# Patient Record
Sex: Male | Born: 1985 | Race: White | Hispanic: No | Marital: Single | Smoking: Current every day smoker
Health system: Southern US, Community
[De-identification: ages and names within clinical notes are randomized; demographics above are authoritative.]

## PROBLEM LIST (undated history)

## (undated) DIAGNOSIS — B1920 Unspecified viral hepatitis C without hepatic coma: Secondary | ICD-10-CM

## (undated) DIAGNOSIS — F419 Anxiety disorder, unspecified: Secondary | ICD-10-CM

## (undated) DIAGNOSIS — M549 Dorsalgia, unspecified: Secondary | ICD-10-CM

---

## 2009-02-05 ENCOUNTER — Emergency Department (HOSPITAL_BASED_OUTPATIENT_CLINIC_OR_DEPARTMENT_OTHER): Admission: EM | Admit: 2009-02-05 | Discharge: 2009-02-05 | Payer: Self-pay | Admitting: Emergency Medicine

## 2009-02-05 ENCOUNTER — Ambulatory Visit: Payer: Self-pay | Admitting: Diagnostic Radiology

## 2009-02-13 ENCOUNTER — Emergency Department (HOSPITAL_BASED_OUTPATIENT_CLINIC_OR_DEPARTMENT_OTHER): Admission: EM | Admit: 2009-02-13 | Discharge: 2009-02-13 | Payer: Self-pay | Admitting: Emergency Medicine

## 2009-03-16 ENCOUNTER — Emergency Department (HOSPITAL_BASED_OUTPATIENT_CLINIC_OR_DEPARTMENT_OTHER): Admission: EM | Admit: 2009-03-16 | Discharge: 2009-03-16 | Payer: Self-pay | Admitting: Emergency Medicine

## 2009-03-16 ENCOUNTER — Ambulatory Visit: Payer: Self-pay | Admitting: Diagnostic Radiology

## 2009-03-30 ENCOUNTER — Ambulatory Visit: Payer: Self-pay | Admitting: Interventional Radiology

## 2009-03-30 ENCOUNTER — Emergency Department (HOSPITAL_BASED_OUTPATIENT_CLINIC_OR_DEPARTMENT_OTHER): Admission: EM | Admit: 2009-03-30 | Discharge: 2009-03-30 | Payer: Self-pay | Admitting: Emergency Medicine

## 2009-04-03 ENCOUNTER — Encounter: Payer: Self-pay | Admitting: Family Medicine

## 2009-04-30 ENCOUNTER — Encounter: Payer: Self-pay | Admitting: Family Medicine

## 2009-06-05 ENCOUNTER — Encounter: Payer: Self-pay | Admitting: Family Medicine

## 2009-07-07 ENCOUNTER — Encounter: Payer: Self-pay | Admitting: Family Medicine

## 2009-07-18 ENCOUNTER — Emergency Department (HOSPITAL_BASED_OUTPATIENT_CLINIC_OR_DEPARTMENT_OTHER): Admission: EM | Admit: 2009-07-18 | Discharge: 2009-07-18 | Payer: Self-pay | Admitting: Emergency Medicine

## 2009-07-23 ENCOUNTER — Emergency Department (HOSPITAL_BASED_OUTPATIENT_CLINIC_OR_DEPARTMENT_OTHER): Admission: EM | Admit: 2009-07-23 | Discharge: 2009-07-23 | Payer: Self-pay | Admitting: Emergency Medicine

## 2009-07-24 ENCOUNTER — Ambulatory Visit: Payer: Self-pay | Admitting: Family Medicine

## 2009-07-24 DIAGNOSIS — M545 Low back pain: Secondary | ICD-10-CM

## 2009-07-26 ENCOUNTER — Telehealth: Payer: Self-pay | Admitting: Family Medicine

## 2009-08-01 ENCOUNTER — Telehealth: Payer: Self-pay | Admitting: Family Medicine

## 2009-08-10 ENCOUNTER — Ambulatory Visit: Payer: Self-pay | Admitting: Diagnostic Radiology

## 2009-08-10 ENCOUNTER — Emergency Department (HOSPITAL_BASED_OUTPATIENT_CLINIC_OR_DEPARTMENT_OTHER): Admission: EM | Admit: 2009-08-10 | Discharge: 2009-08-10 | Payer: Self-pay | Admitting: Emergency Medicine

## 2009-08-27 ENCOUNTER — Encounter: Payer: Self-pay | Admitting: Family Medicine

## 2009-08-28 ENCOUNTER — Telehealth: Payer: Self-pay | Admitting: Family Medicine

## 2009-09-18 ENCOUNTER — Emergency Department (HOSPITAL_BASED_OUTPATIENT_CLINIC_OR_DEPARTMENT_OTHER): Admission: EM | Admit: 2009-09-18 | Discharge: 2009-09-18 | Payer: Self-pay | Admitting: Emergency Medicine

## 2009-09-27 ENCOUNTER — Ambulatory Visit: Payer: Self-pay | Admitting: Interventional Radiology

## 2009-09-27 ENCOUNTER — Emergency Department (HOSPITAL_BASED_OUTPATIENT_CLINIC_OR_DEPARTMENT_OTHER): Admission: EM | Admit: 2009-09-27 | Discharge: 2009-09-27 | Payer: Self-pay | Admitting: Emergency Medicine

## 2009-11-11 ENCOUNTER — Emergency Department (HOSPITAL_BASED_OUTPATIENT_CLINIC_OR_DEPARTMENT_OTHER): Admission: EM | Admit: 2009-11-11 | Discharge: 2009-11-11 | Payer: Self-pay | Admitting: Emergency Medicine

## 2009-11-13 ENCOUNTER — Ambulatory Visit: Payer: Self-pay | Admitting: Radiology

## 2009-11-13 ENCOUNTER — Emergency Department (HOSPITAL_BASED_OUTPATIENT_CLINIC_OR_DEPARTMENT_OTHER): Admission: EM | Admit: 2009-11-13 | Discharge: 2009-11-13 | Payer: Self-pay | Admitting: Emergency Medicine

## 2009-11-22 ENCOUNTER — Emergency Department (HOSPITAL_BASED_OUTPATIENT_CLINIC_OR_DEPARTMENT_OTHER): Admission: EM | Admit: 2009-11-22 | Discharge: 2009-11-23 | Payer: Self-pay | Admitting: Emergency Medicine

## 2009-11-22 ENCOUNTER — Ambulatory Visit: Payer: Self-pay | Admitting: Diagnostic Radiology

## 2009-11-29 ENCOUNTER — Emergency Department (HOSPITAL_BASED_OUTPATIENT_CLINIC_OR_DEPARTMENT_OTHER): Admission: EM | Admit: 2009-11-29 | Discharge: 2009-11-29 | Payer: Self-pay | Admitting: Emergency Medicine

## 2009-12-03 ENCOUNTER — Emergency Department (HOSPITAL_BASED_OUTPATIENT_CLINIC_OR_DEPARTMENT_OTHER): Admission: EM | Admit: 2009-12-03 | Discharge: 2009-12-03 | Payer: Self-pay | Admitting: Emergency Medicine

## 2010-02-18 ENCOUNTER — Emergency Department (HOSPITAL_BASED_OUTPATIENT_CLINIC_OR_DEPARTMENT_OTHER)
Admission: EM | Admit: 2010-02-18 | Discharge: 2010-02-18 | Payer: Self-pay | Source: Home / Self Care | Admitting: Emergency Medicine

## 2010-02-19 ENCOUNTER — Emergency Department (HOSPITAL_BASED_OUTPATIENT_CLINIC_OR_DEPARTMENT_OTHER)
Admission: EM | Admit: 2010-02-19 | Discharge: 2010-02-19 | Payer: Self-pay | Source: Home / Self Care | Admitting: Emergency Medicine

## 2010-02-19 ENCOUNTER — Ambulatory Visit: Payer: Self-pay | Admitting: Emergency Medicine

## 2010-03-18 ENCOUNTER — Emergency Department (HOSPITAL_BASED_OUTPATIENT_CLINIC_OR_DEPARTMENT_OTHER)
Admission: EM | Admit: 2010-03-18 | Discharge: 2010-03-18 | Payer: Self-pay | Source: Home / Self Care | Admitting: Emergency Medicine
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
## 2010-04-03 NOTE — Letter (Signed)
Summary: [facility]  [facility]   Imported By: Lanelle Bal 08/08/2009 11:13:22  _____________________________________________________________________  External Attachment:    Type:   Image     Comment:   External Document

## 2010-04-03 NOTE — Letter (Signed)
Summary: [facility]  [facility]   Imported By: Lanelle Bal 08/08/2009 11:14:10  _____________________________________________________________________  External Attachment:    Type:   Image     Comment:   External Document

## 2010-04-03 NOTE — Letter (Signed)
Summary: [facility]  [facility]   Imported By: Lanelle Bal 08/08/2009 11:16:56  _____________________________________________________________________  External Attachment:    Type:   Image     Comment:   External Document

## 2010-04-03 NOTE — Progress Notes (Signed)
Summary: Pain meds  Phone Note Call from Patient   Caller: Patient Summary of Call: Pt called stating he is in severe pain and meds given at [facility] are not helping. I explained to Pt that he will not be given anything stronger until old records have been received and reviewed.  Initial call taken by: Payton Spark CMA,  Jul 26, 2009 10:56 AM  Follow-up for Phone Call        I have no records. No narcotics will be given. Follow-up by: Seymour Bars DO,  Jul 26, 2009 11:11 AM

## 2010-04-03 NOTE — Letter (Signed)
Summary: [facility]  [facility]   Imported By: Lanelle Bal 08/08/2009 11:17:57  _____________________________________________________________________  External Attachment:    Type:   Image     Comment:   External Document

## 2010-04-03 NOTE — Progress Notes (Signed)
Summary: Old records and pain meds  Phone Note Call from Patient   Caller: Patient Summary of Call: Pt called this morning stating he planned on dropping off past med records for review. Pt wanted to know if he could wait while notes were reviewed so he could take Rx w/ him. I advised Pt that he could drop off records but did not need to wait bc it may take up to 2 days for notes to be reviewed. Pt said he was in a lot a pain and needed Rx, I advised Pt to go to ED if pain is severe. I also told Pt that when Dr. B reviewed notes I will call him. Pt agreed but has proceeded to call several more times to ask about pain meds.  Initial call taken by: Payton Spark CMA,  Aug 01, 2009 4:17 PM  Follow-up for Phone Call        I reviewed his records and have filled the same RX as previous MD filled.  #40 needs to last for 30 days and he has the next month to fill out a narcotic contract here and sign.  Have him f/u for back pain in 2 mos. Follow-up by: Seymour Bars DO,  Aug 01, 2009 4:19 PM    New/Updated Medications: LORCET 10/650 10-650 MG TABS (HYDROCODONE-ACETAMINOPHEN) 1 tab by mouth two times a day as needed severe pain Prescriptions: LORCET 10/650 10-650 MG TABS (HYDROCODONE-ACETAMINOPHEN) 1 tab by mouth two times a day as needed severe pain  #40 x 0   Entered and Authorized by:   Seymour Bars DO   Signed by:   Seymour Bars DO on 08/01/2009   Method used:   Printed then faxed to ...       [facility] [phone]* (retail)       [address]       [place], [place]  [address]       Ph: [phone]       Fax: [phone]   RxID:   [id number]   Appended Document: Old records and pain meds Pt aware of the above and FULLY agreed and verbalized understanding.

## 2010-04-03 NOTE — Progress Notes (Signed)
Summary: PATIENT FLAGGED- NO NARCOTICS  Phone Note From Pharmacy   Caller: [facility] [address] [facility] Summary of Call: Recived fax from Pharmacy about pt doctor shopping and has been flagged by the [place] Controlled Substance Registry.  He is using multiple pharmacies and EDs.  Not filling ANY other RX's besides narcotics.    Pt will no longer get ANY narcotics filled here. Initial call taken by: Seymour Bars DO,  August 28, 2009 8:15 AM     Appended Document: PATIENT FLAGGED- NO NARCOTICS Pt aware of the above

## 2010-04-03 NOTE — Medication Information (Signed)
Summary: [facility] Record[facility]  [facility]   Imported By: Lanelle Bal 09/06/2009 09:34:05  _____________________________________________________________________  External Attachment:    Type:   Image     Comment:   External Document

## 2010-04-05 NOTE — Assessment & Plan Note (Signed)
Summary: FELL IN SHOWER/BACK PAIN/NP/LP   Vital Signs:  Patient profile:   25 year old male Height:      66 inches (167.64 cm) Weight:      138.0 pounds (62.73 kg) BMI:     22.35 Temp:     98.1 degrees F (36.72 degrees C) oral Pulse rate:   79 / minute BP sitting:   124 / 77  (right arm)  Vitals Entered By: Baxter Hire) (February 19, 2010 3:35 PM) CC: lower back pain Pain Assessment Patient in pain? yes     Location: lower back Intensity: 8 Nutritional Status BMI of 19 -24 = normal  Does patient need assistance? Functional Status Self care Ambulation Normal   Primary Care Provider:  Seymour Bars DO  CC:  lower back pain.  History of Present Illness: 25 yo M here for low back pain.  Patient reports about 3-4 days ago he was in the shower when he slipped and hit low back on fauct spout Had a lot of bruising and cut to low back just above buttocks Reports since then having pain and weakness throughout both legs and it is hard to walk Has been to the ED twice for this issue - given pain medication (oxycodone) on 12/18 - 20 tablets - he has used all of this and it has not helped him. No numbness or tingling in extremities No bowel/bladder dysfunction. Has not tried heat or ice. No abdominal pain. Patient has not had imaging or MRI.  He also does not have insurance which is a major barrier to his care. Has also been to the Emergency Department > 15 times in the past year for different pain complaints (upper back, low back, abdominal pain). Within narcotics database he has been to many different providers and received narcotics prescriptions from different pharmacies - noted in Dr. Ovidio Kin note from [facility].  Habits & Providers  Alcohol-Tobacco-Diet     Alcohol drinks/day: 0     Tobacco Status: current     Cigarette Packs/Day: 0.5  Problems Prior to Update: 1)  Back Pain, Lumbar, Chronic  (ICD-724.2)  Allergies: 1)  ! Tramadol Hcl 2)   Toradol  Family History: Reviewed history from 07/24/2009 and no changes required. mother living, AMI at 63 father A Fib. 5 brothers and 1 sister healthy  Social History: Reviewed history from 07/24/2009 and no changes required. finished 9th grade. Lives with mom, dad, toddler son and his girlfriend. smokes 1/2 ppd x 6 yrs. Denies ETOH. Physically active at work.Smoking Status:  current Packs/Day:  0.5  Physical Exam  General:  alert, well-developed, well-nourished, and well-hydrated.   Msk:  Back: Small healed cut just above buttocks.  No apparent bruising. TTP lumbar spine L > R paraspinal region.  No palpable stepoffs - mild tenderness just to left of spinous process about L4 ROM 50 degrees flexion, 10 degrees extension both limited by pain. Strength 5/5 BLEs but pain in low back with hip flexion and leg extension. Negative SLRs MSRs 1+ and equal in bilateral patellar and achilles tendons. Sensation subjectively less on right compared to left throughout whole leg.   Impression & Recommendations:  Problem # 1:  BACK PAIN, LUMBAR, CHRONIC (ICD-724.2) Assessment Deteriorated Believe this is a contusion with spasm in left lumbar region.  No red flag symptoms and complaints into legs are non-anatomic.  Try prednisone dose pack then switch to aleve twice a day.  Flexeril as needed for spasms but no driving on this.  Icing or heat to help with pain.  Given number for Jaynee Eagles to apply for medication assistance card and to help with imaging/PT coverage - will consider these in future depending on his signs and symptoms.  Advised him I will not prescribe narcotics to him.  His history of drug-seeking through different providers and filling at different pharmacies is concerning.  Recent ED note reports probable malingering.  F/u as needed.  Complete Medication List: 1)  Flexeril 5 Mg Tabs (Cyclobenzaprine hcl) .... 1/2 tab by mouth three times a day as needed spasms 2)  Prednisone  (pak) 10 Mg Tabs (Prednisone) .... Take as directed x 6 days  Patient Instructions: 1)  Take prednisone as directed with food.  After finishing this you can take aleve 2 tabs twice a day with food. 2)  Heat or ice (whichever feels better) for 15 minutes 3-4 times a day. 3)  Flexeril as needed for muscle spasms especially at bedtime - if this makes you sleepy, no driving on this. 4)  Call Jaynee Eagles at [phone] to try to get medication assistance and help with assistance for imaging and physical therapy. 5)  Follow up here as needed (if you continue to have issues, for [place] to be able to do other tests and things you will need the assistance above). 6)  If you have any bowel/bladder incontinence, genital numbness, go to emergency department immediately or call 911. 7)  I cannot provide you narcotics based on information in the narcotics database [phone].  I also do not treat anything but fractures with narcotics. Prescriptions: PREDNISONE (PAK) 10 MG TABS (PREDNISONE) Take as directed x 6 days  #QS x 0   Entered and Authorized by:   Norton Blizzard MD   Signed by:   Norton Blizzard MD on 02/19/2010   Method used:   Print then Give to Patient   RxID:   [id number] FLEXERIL 5 MG TABS (CYCLOBENZAPRINE HCL) 1/2 tab by mouth three times a day as needed spasms  #60 x 0   Entered and Authorized by:   Norton Blizzard MD   Signed by:   Norton Blizzard MD on 02/19/2010   Method used:   Print then Give to Patient   RxID:   [id number]    Orders Added: 1)  New Patient Level III [99203]

## 2010-04-16 ENCOUNTER — Emergency Department (HOSPITAL_BASED_OUTPATIENT_CLINIC_OR_DEPARTMENT_OTHER)
Admission: EM | Admit: 2010-04-16 | Discharge: 2010-04-16 | Disposition: A | Discharge status: Home / Self Care | Payer: Self-pay | Attending: Emergency Medicine | Admitting: Emergency Medicine

## 2010-04-16 DIAGNOSIS — G8929 Other chronic pain: Secondary | ICD-10-CM | POA: Insufficient documentation

## 2010-04-16 DIAGNOSIS — Y92009 Unspecified place in unspecified non-institutional (private) residence as the place of occurrence of the external cause: Secondary | ICD-10-CM | POA: Insufficient documentation

## 2010-04-16 DIAGNOSIS — X503XXA Overexertion from repetitive movements, initial encounter: Secondary | ICD-10-CM | POA: Insufficient documentation

## 2010-04-16 DIAGNOSIS — F17200 Nicotine dependence, unspecified, uncomplicated: Secondary | ICD-10-CM | POA: Insufficient documentation

## 2010-04-16 DIAGNOSIS — S335XXA Sprain of ligaments of lumbar spine, initial encounter: Secondary | ICD-10-CM | POA: Insufficient documentation

## 2010-05-17 LAB — URINE CULTURE
Colony Count: NO GROWTH
Culture: NO GROWTH

## 2010-05-17 LAB — URINALYSIS, ROUTINE W REFLEX MICROSCOPIC
Bilirubin Urine: NEGATIVE
Bilirubin Urine: NEGATIVE
Bilirubin Urine: NEGATIVE
Glucose, UA: NEGATIVE mg/dL
Glucose, UA: NEGATIVE mg/dL
Hgb urine dipstick: NEGATIVE
Ketones, ur: >80 mg/dL — AB
Ketones, ur: NEGATIVE mg/dL
Nitrite: NEGATIVE
Nitrite: NEGATIVE
Nitrite: POSITIVE — AB
Protein, ur: NEGATIVE mg/dL
Protein, ur: NEGATIVE mg/dL
Protein, ur: NEGATIVE mg/dL
Protein, ur: NEGATIVE mg/dL
Specific Gravity, Urine: 1.01 (ref 1.005–1.030)
Specific Gravity, Urine: 1.018 (ref 1.005–1.030)
Urobilinogen, UA: 0.2 mg/dL (ref 0.0–1.0)
Urobilinogen, UA: 0.2 mg/dL (ref 0.0–1.0)
Urobilinogen, UA: 0.2 mg/dL (ref 0.0–1.0)
pH: 7 (ref 5.0–8.0)
pH: 6 (ref 5.0–8.0)
pH: 7.5 (ref 5.0–8.0)
pH: 7 (ref 5.0–8.0)

## 2010-05-17 LAB — DIFFERENTIAL
Basophils Absolute: 0 10*3/uL (ref 0.0–0.1)
Basophils Relative: 0 % (ref 0–1)
Basophils Relative: 0 % (ref 0–1)
Eosinophils Relative: 2 % (ref 0–5)
Eosinophils Relative: 0 % (ref 0–5)
Lymphocytes Relative: 25 % (ref 12–46)
Lymphocytes Relative: 4 % — ABNORMAL LOW (ref 12–46)
Lymphocytes Relative: 16 % (ref 12–46)
Lymphs Abs: 0.9 10*3/uL (ref 0.7–4.0)
Lymphs Abs: 1.7 10*3/uL (ref 0.7–4.0)
Monocytes Absolute: 2.5 10*3/uL — ABNORMAL HIGH (ref 0.1–1.0)
Monocytes Absolute: 1.1 10*3/uL — ABNORMAL HIGH (ref 0.1–1.0)
Monocytes Relative: 12 % (ref 3–12)
Monocytes Relative: 11 % (ref 3–12)
Monocytes Relative: 10 % (ref 3–12)
Neutro Abs: 5 10*3/uL (ref 1.7–7.7)
Neutro Abs: 19.4 10*3/uL — ABNORMAL HIGH (ref 1.7–7.7)
Neutro Abs: 7.3 10*3/uL (ref 1.7–7.7)
Neutrophils Relative %: 69 % (ref 43–77)

## 2010-05-17 LAB — CBC
HCT: 43.5 % (ref 39.0–52.0)
HCT: 48.3 % (ref 39.0–52.0)
Hemoglobin: 14.7 g/dL (ref 13.0–17.0)
Hemoglobin: 14.8 g/dL (ref 13.0–17.0)
Hemoglobin: 16.5 g/dL (ref 13.0–17.0)
MCH: 31 pg (ref 26.0–34.0)
MCHC: 33.8 g/dL (ref 30.0–36.0)
MCHC: 34.8 g/dL (ref 30.0–36.0)
MCV: 88.5 fL (ref 78.0–100.0)
Platelets: 153 10*3/uL (ref 150–400)
RBC: 4.92 MIL/uL (ref 4.22–5.81)
RBC: 5.33 MIL/uL (ref 4.22–5.81)
RDW: 12.7 % (ref 11.5–15.5)
WBC: 8 10*3/uL (ref 4.0–10.5)
WBC: 10.7 10*3/uL — ABNORMAL HIGH (ref 4.0–10.5)

## 2010-05-17 LAB — BASIC METABOLIC PANEL
BUN: 10 mg/dL (ref 6–23)
CO2: 22 mEq/L (ref 19–32)
Calcium: 9.8 mg/dL (ref 8.4–10.5)
Calcium: 9.7 mg/dL (ref 8.4–10.5)
Creatinine, Ser: 0.7 mg/dL (ref 0.4–1.5)
GFR calc Af Amer: >60 mL/min (ref >60)
GFR calc non Af Amer: >60 mL/min (ref >60)
GFR calc non Af Amer: >60 mL/min (ref >60)
Glucose, Bld: 126 mg/dL — ABNORMAL HIGH (ref 70–99)
Glucose, Bld: 110 mg/dL — ABNORMAL HIGH (ref 70–99)
Potassium: 3.7 mEq/L (ref 3.5–5.1)
Sodium: 144 mEq/L (ref 135–145)

## 2010-05-17 LAB — URINE MICROSCOPIC-ADD ON

## 2010-05-17 LAB — COMPREHENSIVE METABOLIC PANEL
CO2: 27 mEq/L (ref 19–32)
Calcium: 9.3 mg/dL (ref 8.4–10.5)
Creatinine, Ser: 0.8 mg/dL (ref 0.4–1.5)
GFR calc Af Amer: >60 mL/min (ref >60)
Glucose, Bld: 90 mg/dL (ref 70–99)
Potassium: 4.1 mEq/L (ref 3.5–5.1)
Total Bilirubin: 0.5 mg/dL (ref 0.3–1.2)
Total Protein: 6.6 g/dL (ref 6.0–8.3)

## 2010-05-17 LAB — GC/CHLAMYDIA PROBE AMP, GENITAL
Chlamydia, DNA Probe: NEGATIVE
Chlamydia, DNA Probe: NEGATIVE

## 2010-05-19 ENCOUNTER — Emergency Department (HOSPITAL_BASED_OUTPATIENT_CLINIC_OR_DEPARTMENT_OTHER)
Admission: EM | Admit: 2010-05-19 | Discharge: 2010-05-19 | Disposition: A | Discharge status: Home / Self Care | Payer: Self-pay | Attending: Emergency Medicine | Admitting: Emergency Medicine

## 2010-05-19 DIAGNOSIS — G8929 Other chronic pain: Secondary | ICD-10-CM | POA: Insufficient documentation

## 2010-05-19 DIAGNOSIS — F17200 Nicotine dependence, unspecified, uncomplicated: Secondary | ICD-10-CM | POA: Insufficient documentation

## 2010-05-19 DIAGNOSIS — N453 Epididymo-orchitis: Secondary | ICD-10-CM | POA: Insufficient documentation

## 2010-05-21 LAB — URINALYSIS, ROUTINE W REFLEX MICROSCOPIC
Bilirubin Urine: NEGATIVE
Glucose, UA: NEGATIVE mg/dL
Glucose, UA: NEGATIVE mg/dL
Ketones, ur: 15 mg/dL — AB
Ketones, ur: NEGATIVE mg/dL
Leukocytes, UA: NEGATIVE
Nitrite: NEGATIVE
Protein, ur: NEGATIVE mg/dL
pH: 6 (ref 5.0–8.0)
pH: 7 (ref 5.0–8.0)

## 2010-05-21 LAB — URINE CULTURE

## 2010-05-21 LAB — BASIC METABOLIC PANEL
CO2: 28 mEq/L (ref 19–32)
Chloride: 110 mEq/L (ref 96–112)
Creatinine, Ser: 0.9 mg/dL (ref 0.4–1.5)
GFR calc Af Amer: >60 mL/min (ref >60)
Potassium: 3.9 mEq/L (ref 3.5–5.1)

## 2010-05-21 LAB — CBC
HCT: 41.4 % (ref 39.0–52.0)
Hemoglobin: 13.9 g/dL (ref 13.0–17.0)
MCHC: 33.5 g/dL (ref 30.0–36.0)
MCV: 89.6 fL (ref 78.0–100.0)
RBC: 4.62 MIL/uL (ref 4.22–5.81)
WBC: 10.9 10*3/uL — ABNORMAL HIGH (ref 4.0–10.5)

## 2010-05-21 LAB — DIFFERENTIAL
Basophils Relative: 2 % — ABNORMAL HIGH (ref 0–1)
Eosinophils Absolute: 0.2 10*3/uL (ref 0.0–0.7)
Eosinophils Relative: 2 % (ref 0–5)
Lymphs Abs: 2.2 10*3/uL (ref 0.7–4.0)
Monocytes Absolute: 1.1 10*3/uL — ABNORMAL HIGH (ref 0.1–1.0)
Monocytes Relative: 10 % (ref 3–12)

## 2010-05-21 LAB — URINE MICROSCOPIC-ADD ON

## 2010-06-05 LAB — URINALYSIS, ROUTINE W REFLEX MICROSCOPIC
Bilirubin Urine: NEGATIVE
Hgb urine dipstick: NEGATIVE
Nitrite: NEGATIVE
Nitrite: NEGATIVE
Protein, ur: NEGATIVE mg/dL
Specific Gravity, Urine: 1.025 (ref 1.005–1.030)
Urobilinogen, UA: 0.2 mg/dL (ref 0.0–1.0)
Urobilinogen, UA: 0.2 mg/dL (ref 0.0–1.0)
pH: 7 (ref 5.0–8.0)

## 2010-06-09 ENCOUNTER — Emergency Department (HOSPITAL_BASED_OUTPATIENT_CLINIC_OR_DEPARTMENT_OTHER)
Admission: EM | Admit: 2010-06-09 | Discharge: 2010-06-09 | Disposition: A | Discharge status: Home / Self Care | Payer: Self-pay | Attending: Emergency Medicine | Admitting: Emergency Medicine

## 2010-06-09 DIAGNOSIS — N509 Disorder of male genital organs, unspecified: Secondary | ICD-10-CM | POA: Insufficient documentation

## 2010-06-09 DIAGNOSIS — F17200 Nicotine dependence, unspecified, uncomplicated: Secondary | ICD-10-CM | POA: Insufficient documentation

## 2010-06-09 DIAGNOSIS — G8929 Other chronic pain: Secondary | ICD-10-CM | POA: Insufficient documentation

## 2010-06-09 LAB — URINALYSIS, ROUTINE W REFLEX MICROSCOPIC
Glucose, UA: NEGATIVE mg/dL
Hgb urine dipstick: NEGATIVE
Ketones, ur: NEGATIVE mg/dL
Protein, ur: NEGATIVE mg/dL
Urobilinogen, UA: 1 mg/dL (ref 0.0–1.0)

## 2010-10-25 ENCOUNTER — Emergency Department (HOSPITAL_BASED_OUTPATIENT_CLINIC_OR_DEPARTMENT_OTHER)
Admission: EM | Admit: 2010-10-25 | Discharge: 2010-10-25 | Disposition: A | Discharge status: Home / Self Care | Payer: Self-pay | Attending: Emergency Medicine | Admitting: Emergency Medicine

## 2010-10-25 ENCOUNTER — Encounter: Payer: Self-pay | Admitting: *Deleted

## 2010-10-25 DIAGNOSIS — X500XXA Overexertion from strenuous movement or load, initial encounter: Secondary | ICD-10-CM | POA: Insufficient documentation

## 2010-10-25 DIAGNOSIS — F17200 Nicotine dependence, unspecified, uncomplicated: Secondary | ICD-10-CM | POA: Insufficient documentation

## 2010-10-25 DIAGNOSIS — IMO0002 Reserved for concepts with insufficient information to code with codable children: Secondary | ICD-10-CM

## 2010-10-25 DIAGNOSIS — S239XXA Sprain of unspecified parts of thorax, initial encounter: Secondary | ICD-10-CM | POA: Insufficient documentation

## 2010-10-25 DIAGNOSIS — M546 Pain in thoracic spine: Secondary | ICD-10-CM | POA: Insufficient documentation

## 2010-10-25 HISTORY — DX: Dorsalgia, unspecified: M54.9

## 2010-10-25 HISTORY — DX: Unspecified viral hepatitis C without hepatic coma: B19.20

## 2010-10-25 MED ORDER — OXYCODONE-ACETAMINOPHEN 5-325 MG PO TABS
1.0000 | ORAL_TABLET | Freq: Once | ORAL | Status: AC
  Administered 2010-10-25: 1 via ORAL
  Filled 2010-10-25: qty 1

## 2010-10-25 MED ORDER — OXYCODONE HCL 5 MG PO CAPS: 5.0000 mg | ORAL_CAPSULE | ORAL | Status: AC | PRN

## 2010-10-25 NOTE — ED Notes (Signed)
Pt ambulatory without difficulty and moves all extremities well during exam. No obvious injury noted upon assessment. Does not appear to be in any acute distress/discomfort.

## 2010-10-25 NOTE — ED Notes (Signed)
Pt states that he has been doing unusually heavy lifting and presents with mid to low back pain as well as right shoulder pain pt has a hx of back problems

## 2010-10-26 ENCOUNTER — Encounter (HOSPITAL_BASED_OUTPATIENT_CLINIC_OR_DEPARTMENT_OTHER): Payer: Self-pay | Admitting: Emergency Medicine

## 2010-10-26 NOTE — ED Provider Notes (Signed)
History     CSN: [id number] Arrival date & time: 10/25/2010  9:47 PM  Chief Complaint  Patient presents with  . 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  He took Ibuprofen without relief.  He has a history of chronic back pain.  He has also been seen several times for epididymitis.  Review of the [place] controlled substance database shows no recent narcotic prescription; the last narcotic prescription reported was in March 2012.  Patient is a 25 y.o. male presenting with back pain. The history is provided by the patient and medical records. No language interpreter was used.  Back Pain  This is a new problem. The current episode started 6 to 12 hours ago. The problem occurs constantly. The problem has not changed since onset.Associated with: Doing tree work, cutting down trees and branches. The pain is present in the thoracic spine. The quality of the pain is described as aching. The pain does not radiate. The pain is at a severity of 7/10. The pain is moderate. The symptoms are aggravated by twisting and bending. He has tried NSAIDs for the symptoms. The treatment provided no relief.    Past Medical History  Diagnosis Date  . Back pain   . Hepatitis C     No past surgical history on file.  History reviewed. No pertinent family history.  History  Substance Use Topics  . Smoking status: Current Everyday Smoker -- 0.5 packs/day  . Smokeless tobacco: Not on file  . Alcohol Use: No      Review of Systems  Musculoskeletal: Positive for back pain.  All other systems reviewed and are negative.    Physical Exam  BP 138/73  Pulse 95  Temp(Src) 98.6 F (37 C) (Oral)  Resp 16  SpO2 100%  Physical Exam  Nursing note and vitals reviewed. Constitutional: He appears well-developed and well-nourished.       In mild to moderate distress with upper back pain.  HENT:  Head: Atraumatic.  Right Ear: External ear normal.  Left Ear: External ear  normal.  Mouth/Throat: Oropharynx is clear and moist.  Eyes: EOM are normal. Pupils are equal, round, and reactive to light. No scleral icterus.  Neck: Normal range of motion. Neck supple. No JVD present. No tracheal deviation present.  Cardiovascular: Normal rate, regular rhythm and normal heart sounds.   Pulmonary/Chest: Effort normal and breath sounds normal. He exhibits no tenderness.  Abdominal: Soft. Bowel sounds are normal.  Musculoskeletal:       He localizes pain to the midthoracic spine.  There is no bony deformity or point of tenderness.  Lymphadenopathy:    He has no cervical adenopathy.  Neurological: He is alert. He has normal reflexes.       No sensory of motor deficits.  Skin: Skin is warm and dry.  Psychiatric: He has a normal mood and affect. His behavior is normal.    ED Course  Procedures  MDM  Pt seen --> physical exam performed.  Old charts reviewed.  [place] controlled substance data base reviewed.  Rx for oxycodone for his pain; advised that where he had multiple visits for back pain that he might be better served by seeing a pain management specialist.  Referred to Dr. Nilsa Nutting.     Carleene Cooper III, MD 10/26/10 [phone]

## 2010-11-11 ENCOUNTER — Encounter (HOSPITAL_BASED_OUTPATIENT_CLINIC_OR_DEPARTMENT_OTHER): Payer: Self-pay | Admitting: *Deleted

## 2010-11-11 ENCOUNTER — Emergency Department (HOSPITAL_BASED_OUTPATIENT_CLINIC_OR_DEPARTMENT_OTHER)
Admission: EM | Admit: 2010-11-11 | Discharge: 2010-11-11 | Discharge status: Against Medical Advice | Payer: Self-pay | Attending: Emergency Medicine | Admitting: Emergency Medicine

## 2010-11-11 ENCOUNTER — Emergency Department (INDEPENDENT_AMBULATORY_CARE_PROVIDER_SITE_OTHER): Payer: Self-pay

## 2010-11-11 ENCOUNTER — Emergency Department (HOSPITAL_BASED_OUTPATIENT_CLINIC_OR_DEPARTMENT_OTHER)
Admission: EM | Admit: 2010-11-11 | Discharge: 2010-11-11 | Disposition: A | Discharge status: Home / Self Care | Payer: Self-pay | Attending: Emergency Medicine | Admitting: Emergency Medicine

## 2010-11-11 DIAGNOSIS — Z9181 History of falling: Secondary | ICD-10-CM

## 2010-11-11 DIAGNOSIS — W19XXXA Unspecified fall, initial encounter: Secondary | ICD-10-CM

## 2010-11-11 DIAGNOSIS — M545 Low back pain, unspecified: Secondary | ICD-10-CM

## 2010-11-11 DIAGNOSIS — S39012A Strain of muscle, fascia and tendon of lower back, initial encounter: Secondary | ICD-10-CM

## 2010-11-11 DIAGNOSIS — M546 Pain in thoracic spine: Secondary | ICD-10-CM

## 2010-11-11 DIAGNOSIS — M549 Dorsalgia, unspecified: Secondary | ICD-10-CM

## 2010-11-11 DIAGNOSIS — IMO0002 Reserved for concepts with insufficient information to code with codable children: Secondary | ICD-10-CM | POA: Insufficient documentation

## 2010-11-11 DIAGNOSIS — W108XXA Fall (on) (from) other stairs and steps, initial encounter: Secondary | ICD-10-CM | POA: Insufficient documentation

## 2010-11-11 MED ORDER — OXYCODONE HCL 10 MG PO TB12: 10.0000 mg | ORAL_TABLET | Freq: Two times a day (BID) | ORAL | Status: AC

## 2010-11-11 MED ORDER — MORPHINE SULFATE 4 MG/ML IJ SOLN
4.0000 mg | Freq: Once | INTRAMUSCULAR | Status: AC
  Administered 2010-11-11: 4 mg via INTRAVENOUS
  Filled 2010-11-11: qty 1

## 2010-11-11 MED ORDER — CYCLOBENZAPRINE HCL 10 MG PO TABS: 10.0000 mg | ORAL_TABLET | Freq: Two times a day (BID) | ORAL | Status: AC | PRN

## 2010-11-11 MED ORDER — OXYCODONE-ACETAMINOPHEN 5-325 MG PO TABS
1.0000 | ORAL_TABLET | Freq: Once | ORAL | Status: AC
  Administered 2010-11-11: 1 via ORAL
  Filled 2010-11-11: qty 1

## 2010-11-11 NOTE — ED Notes (Signed)
Pt was here earlier, but had to leave and has now returned.

## 2010-11-11 NOTE — ED Provider Notes (Signed)
Scribed for No att. providers found, the patient was seen in room MH05/MH05 . This chart was scribed by Ellie Lunch. This patient's care was started at 9:04 PM.   CSN: [id number] Arrival date & time: 11/11/2010  8:18 PM  Chief Complaint  Patient presents with  . Back Pain   HPI Dave Mendez is a 25 y.o. male with a reported history of chronic back pain presents to the Emergency Department complaining of back pain starting yesterday after slipping down wet stairs. Rates pain 7/10 in severity. Pt reports impact on tail bone and back. Denies LOC. Denies tingling or numbness in legs. Treated pain with Ib profen and flexeril with no improvement. Pt also reports a migraine that began after the fall. Pt states migraine was improved this morning but has since returned. Pt specifically requests pain medication before leaving b/c his pharmacy is closed. Also requests something stronger and longer lasting than he received on his last visit 10/25/10. Denies numbness/tingling/weakness of extremities. No retention/incontinces. No saddle anesthesia   Past Medical History  Diagnosis Date  . Back pain   . Hepatitis C   . Back pain   . Hepatitis C     History reviewed. No pertinent past surgical history.  MEDICATIONS:  Previous Medications   CYCLOBENZAPRINE HCL (FLEXERIL PO)    Take 0.5 tablets by mouth 3 (three) times daily as needed. For muscle spasms    IBUPROFEN (ADVIL,MOTRIN) 200 MG TABLET    Take 400 mg by mouth every 8 (eight) hours as needed. For pain   MULTIPLE VITAMIN (MULTIVITAMIN) TABLET    Take 1 tablet by mouth daily.       ALLERGIES:  Allergies as of 11/11/2010 - Review Complete 11/11/2010  Allergen Reaction Noted  . Tramadol hcl Hives   . Tylenol (acetaminophen) Other (See Comments) 10/25/2010  . Ketorolac tromethamine Rash 07/24/2009      History reviewed. No pertinent family history.  History  Substance Use Topics  . Smoking status: Current Everyday Smoker -- 0.5 packs/day    . Smokeless tobacco: Not on file  . Alcohol Use: No     Review of Systems 10 Systems reviewed and are negative for acute change except as noted in the HPI.   Physical Exam  BP 121/68  Pulse 94  Temp(Src) 99.1 F (37.3 C) (Oral)  Resp 20  Ht 5\' 5"  (1.651 m)  Wt 150 lb (68.04 kg)  BMI 24.96 kg/m2  SpO2 96%  Physical Exam  Nursing note and vitals reviewed. Constitutional: He is oriented to person, place, and time. He appears well-developed and well-nourished.  HENT:  Head: Normocephalic and atraumatic.  Eyes: Conjunctivae and EOM are normal.  Neck: Neck supple.  Cardiovascular: Normal rate and regular rhythm.   Pulmonary/Chest: Effort normal and breath sounds normal.  Abdominal: Soft. There is no tenderness.  Musculoskeletal: Normal range of motion.       +thoracic and lumbar paraspinal and midline ttp with paraspinal spasm  No c spine ttp  Neurological: He is alert and oriented to person, place, and time.       Strength 5/5 all extr No saddle anesthesia  Skin: Skin is warm and dry.  Psychiatric: He has a normal mood and affect. Judgment normal.   Procedures  OTHER DATA REVIEWED: Nursing notes, vital signs, and past medical records reviewed.   DIAGNOSTIC STUDIES: Oxygen Saturation is 96% on room air, normal by my interpretation.    LABS / RADIOLOGY:  Results for orders placed during the hospital  encounter of 06/09/10  URINALYSIS, ROUTINE W REFLEX MICROSCOPIC      Component Value Range   Color, Urine YELLOW  YELLOW    Appearance CLOUDY (*) CLEAR    Specific Gravity, Urine 1.018  1.005 - 1.030    pH 7.0  5.0 - 8.0    Glucose, UA NEGATIVE  NEGATIVE (mg/dL)   Hgb urine dipstick NEGATIVE  NEGATIVE    Bilirubin Urine NEGATIVE  NEGATIVE    Ketones, ur NEGATIVE  NEGATIVE (mg/dL)   Protein, ur NEGATIVE  NEGATIVE (mg/dL)   Urobilinogen, UA 1.0  0.0 - 1.0 (mg/dL)   Nitrite NEGATIVE  NEGATIVE    Leukocytes, UA    NEGATIVE    Value: NEGATIVE MICROSCOPIC NOT DONE ON  URINES WITH NEGATIVE PROTEIN, BLOOD, LEUKOCYTES, NITRITE, OR GLUCOSE <1000 mg/dL.       DG Thoracic Spine 4V (Final result)   Result time:11/11/10 2156    Final result by Rad Results In Interface (11/11/10 21:56:45)    Narrative:   *RADIOLOGY REPORT*  Clinical Data: Back pain status post fall  THORACIC SPINE - 4+ VIEW  Comparison: 09/27/2009  Findings: Minimal dextrocurvature, similar to prior. Otherwise, the imaged thoracic vertebral bodies and inter-vertebral disc spaces are maintained. No displaced acute fracture or dislocation identified. The para-vertebral and overlying soft tissues are within normal limits.  IMPRESSION: No acute osseous abnormality.  Original Report Authenticated By: Waneta Martins, M.D.            DG Lumbar Spine Complete (Final result)   Result time:11/11/10 2155    Final result by Rad Results In Interface (11/11/10 21:55:40)    Narrative:   *RADIOLOGY REPORT*  Clinical Data: Mid and lower back pain status post fall  LUMBAR SPINE - COMPLETE 4+ VIEW  Comparison: 11/22/2009 CT  Findings: The imaged vertebral bodies and inter-vertebral disc spaces are maintained. No displaced acute fracture or dislocation identified. The para-vertebral and overlying soft tissues are within normal limits. The sacrum is obscured by overlying bowel gas.  IMPRESSION: No acute osseous abnormality.  Original Report Authenticated By: Waneta Martins, M.D.      MDM:  Discussed pain management with pt and plan to xray back because of fall.   XR reviewed and negative. Home with supportive care. Precautions for return  IMPRESSION: Diagnoses that have been ruled out:  Diagnoses that are still under consideration:  Final diagnoses:  History of fall  Back strain    PLAN:  Home  The patient is to return the emergency department if there is any worsening of symptoms. I have reviewed the discharge instructions with the patient  CONDITION ON  DISCHARGE: Good  MEDICATIONS GIVEN IN THE E.D.  Medications  morphine injection 4 mg (4 mg Intravenous Given 11/11/10 2116)    DISCHARGE MEDICATIONS: New Prescriptions   CYCLOBENZAPRINE (FLEXERIL) 10 MG TABLET    Take 1 tablet (10 mg total) by mouth 2 (two) times daily as needed for muscle spasms.   OXYCODONE (OXYCONTIN) 10 MG 12 HR TABLET    Take 1 tablet (10 mg total) by mouth every 12 (twelve) hours.   SCRIBE ATTESTATION: I personally performed the services described in this documentation, which was scribed in my presence. The recorded information has been reviewed and considered. No att. providers found         Forbes Cellar, MD 11/12/10 [phone]

## 2010-11-11 NOTE — ED Notes (Signed)
Pt given warm blanket- pt's family given snack and soda

## 2010-11-11 NOTE — ED Notes (Signed)
Returned from xray

## 2010-11-11 NOTE — ED Notes (Signed)
Pt states he slipped down some wet steps yesterday and injured his back.

## 2010-11-11 NOTE — ED Notes (Signed)
Pt given rx x 1 at d/c- d/c home with family member

## 2010-12-15 ENCOUNTER — Emergency Department (HOSPITAL_BASED_OUTPATIENT_CLINIC_OR_DEPARTMENT_OTHER)
Admission: EM | Admit: 2010-12-15 | Discharge: 2010-12-15 | Disposition: A | Discharge status: Home / Self Care | Payer: Medicaid Other | Attending: Emergency Medicine | Admitting: Emergency Medicine

## 2010-12-15 ENCOUNTER — Encounter (HOSPITAL_BASED_OUTPATIENT_CLINIC_OR_DEPARTMENT_OTHER): Payer: Self-pay | Admitting: Emergency Medicine

## 2010-12-15 DIAGNOSIS — F17200 Nicotine dependence, unspecified, uncomplicated: Secondary | ICD-10-CM | POA: Insufficient documentation

## 2010-12-15 DIAGNOSIS — M549 Dorsalgia, unspecified: Secondary | ICD-10-CM | POA: Insufficient documentation

## 2010-12-15 DIAGNOSIS — G8929 Other chronic pain: Secondary | ICD-10-CM | POA: Insufficient documentation

## 2010-12-15 MED ORDER — CYCLOBENZAPRINE HCL 10 MG PO TABS: 5.0000 mg | ORAL_TABLET | Freq: Two times a day (BID) | ORAL | Status: AC | PRN

## 2010-12-15 MED ORDER — HYDROCODONE-ACETAMINOPHEN 5-500 MG PO TABS: 1.0000 | ORAL_TABLET | Freq: Four times a day (QID) | ORAL | Status: AC | PRN

## 2010-12-15 NOTE — ED Notes (Signed)
Pt guarding Lower back

## 2010-12-15 NOTE — ED Provider Notes (Signed)
Medical screening examination/treatment/procedure(s) were performed by non-physician practitioner and as supervising physician I was immediately available for consultation/collaboration.   Tyann Niehaus A Arya Luttrull, MD 12/15/10 1928 

## 2010-12-15 NOTE — ED Provider Notes (Signed)
History     CSN: [id number] Arrival date & time: 12/15/2010 12:31 PM  Chief Complaint  Patient presents with  . 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 male presenting with back pain. The history is provided by the patient. No language interpreter was used.  Back Pain  This is a recurrent problem. The current episode started 3 to 5 hours ago. The problem occurs constantly. The problem has not changed since onset.The pain is associated with lifting heavy objects. The pain is present in the lumbar spine. The quality of the pain is described as aching. The pain does not radiate. The pain is moderate. The symptoms are aggravated by bending. The pain is the same all the time. Pertinent negatives include no fever, no bowel incontinence, no perianal numbness, no leg pain and no weakness. He has tried NSAIDs for the symptoms. The treatment provided no relief.    Past Medical History  Diagnosis Date  . Back pain   . Hepatitis C   . Back pain   . Hepatitis C     History reviewed. No pertinent past surgical history.  History reviewed. No pertinent family history.  History  Substance Use Topics  . Smoking status: Current Everyday Smoker -- 0.5 packs/day  . Smokeless tobacco: Not on file  . Alcohol Use: No      Review of Systems  Constitutional: Negative for fever.  Gastrointestinal: Negative for bowel incontinence.  Musculoskeletal: Positive for back pain.  Neurological: Negative for weakness.  All other systems reviewed and are negative.    Allergies  Tramadol hcl; Tylenol; and Ketorolac tromethamine  Home Medications   Current Outpatient  Rx  Name Route Sig Dispense Refill  . FLEXERIL PO Oral Take 0.5 tablets by mouth 3 (three) times daily as needed. For muscle spasms     . IBUPROFEN 200 MG PO TABS Oral Take 400 mg by mouth every 8 (eight) hours as needed. For pain    . ONE-DAILY MULTI VITAMINS PO TABS Oral Take 1 tablet by mouth daily.        BP 108/59  Pulse 75  Temp 97.8 F (36.6 C)  Resp 20  SpO2 99%  Physical Exam  Nursing note and vitals reviewed. Constitutional: He is oriented to person, place, and time. He appears well-developed and well-nourished.  HENT:  Head: Atraumatic.  Neck: Normal range of motion.  Cardiovascular: Normal rate and regular rhythm.   Pulmonary/Chest: Effort normal and breath sounds normal.  Musculoskeletal:       Lumbar back: He exhibits tenderness.  Neurological: He is alert and oriented to person, place, and time.  Skin: Skin is warm and dry.  Psychiatric: He has a normal mood and affect.    ED Course  Procedures (including critical care time)  Labs Reviewed - No data to display No results found.   1. Chronic back pain       MDM  No neuro deficits:pt has been seen multiple times for the same thing:discussed with pt that we will not continue to treat him having chronic pain        Teressa Lower, NP 12/15/10 1303

## 2010-12-15 NOTE — ED Notes (Signed)
Care plan safe use of meds reviewed with pt need for PMD for pain controll

## 2011-01-06 ENCOUNTER — Encounter (HOSPITAL_BASED_OUTPATIENT_CLINIC_OR_DEPARTMENT_OTHER): Payer: Self-pay | Admitting: Emergency Medicine

## 2011-01-06 ENCOUNTER — Emergency Department (HOSPITAL_BASED_OUTPATIENT_CLINIC_OR_DEPARTMENT_OTHER)
Admission: EM | Admit: 2011-01-06 | Discharge: 2011-01-06 | Disposition: A | Discharge status: Home / Self Care | Payer: Medicaid Other | Attending: Emergency Medicine | Admitting: Emergency Medicine

## 2011-01-06 DIAGNOSIS — F17200 Nicotine dependence, unspecified, uncomplicated: Secondary | ICD-10-CM | POA: Insufficient documentation

## 2011-01-06 DIAGNOSIS — K089 Disorder of teeth and supporting structures, unspecified: Secondary | ICD-10-CM | POA: Insufficient documentation

## 2011-01-06 DIAGNOSIS — K0889 Other specified disorders of teeth and supporting structures: Secondary | ICD-10-CM

## 2011-01-06 MED ORDER — OXYCODONE HCL 5 MG PO TABS: 5.0000 mg | ORAL_TABLET | Freq: Four times a day (QID) | ORAL | Status: DC | PRN

## 2011-01-06 MED ORDER — MORPHINE SULFATE 4 MG/ML IJ SOLN
4.0000 mg | Freq: Once | INTRAMUSCULAR | Status: DC
  Filled 2011-01-06: qty 1

## 2011-01-06 MED ORDER — CLINDAMYCIN HCL 150 MG PO CAPS: 150.0000 mg | ORAL_CAPSULE | Freq: Four times a day (QID) | ORAL | Status: AC

## 2011-01-06 MED ORDER — MORPHINE SULFATE 4 MG/ML IJ SOLN
4.0000 mg | Freq: Once | INTRAMUSCULAR | Status: AC
  Administered 2011-01-06: 4 mg via INTRAMUSCULAR

## 2011-01-06 MED ORDER — OXYCODONE HCL 5 MG PO TABS: 5.0000 mg | ORAL_TABLET | Freq: Four times a day (QID) | ORAL | Status: AC | PRN

## 2011-01-06 MED ORDER — CLINDAMYCIN HCL 150 MG PO CAPS: 150.0000 mg | ORAL_CAPSULE | Freq: Four times a day (QID) | ORAL | Status: DC

## 2011-01-06 MED ORDER — ONDANSETRON HCL 4 MG/2ML IJ SOLN
4.0000 mg | Freq: Once | INTRAMUSCULAR | Status: AC
  Administered 2011-01-06: 4 mg via INTRAMUSCULAR
  Filled 2011-01-06: qty 2

## 2011-01-06 NOTE — ED Notes (Signed)
Left upper dental pain x 3 days.  Pt relates he had fever last night.  Pt has broken tooth and cavities.

## 2011-01-06 NOTE — ED Provider Notes (Signed)
History     CSN: [id number] Arrival date & time: 01/06/2011 11:13 AM   First MD Initiated Contact with Patient 01/06/11 1204      Chief Complaint  Patient presents with  . Dental Pain    (Consider location/radiation/quality/duration/timing/severity/associated sxs/prior treatment) Patient is a 25 y.o. male presenting with tooth pain. The history is provided by the patient. No language interpreter was used.  Dental PainThe primary symptoms include mouth pain and dental injury. The symptoms began 3 to 5 days ago. The symptoms are worsening. The symptoms are new. The symptoms occur constantly.  The dental injury occurred 3 - 5 days ago. Affected teeth include: 14/left upper first molar. The injury is a fracture. The injury was caused by an unknown mechanism. His tetanus status is up to date.  Additional symptoms include: gum tenderness and purulent gums.  Pt broke a tooth.  Pt complains of pain.  Past Medical History  Diagnosis Date  . Back pain   . Hepatitis C     History reviewed. No pertinent past surgical history.  History reviewed. No pertinent family history.  History  Substance Use Topics  . Smoking status: Current Everyday Smoker -- 1.0 packs/day  . Smokeless tobacco: Not on file  . Alcohol Use: No      Review of Systems  All other systems reviewed and are negative.    Allergies  Tramadol hcl; Tylenol; and Ketorolac tromethamine  Home Medications   Current Outpatient Rx  Name Route Sig Dispense Refill  . FLEXERIL PO Oral Take 0.5 tablets by mouth 3 (three) times daily as needed. For muscle spasms     . IBUPROFEN 200 MG PO TABS Oral Take 400 mg by mouth every 8 (eight) hours as needed. For pain    . ONE-DAILY MULTI VITAMINS PO TABS Oral Take 1 tablet by mouth daily.        BP 142/78  Pulse 103  Temp(Src) 97.8 F (36.6 C) (Oral)  Ht 5\' 5"  (1.651 m)  Wt 155 lb (70.308 kg)  BMI 25.79 kg/m2  SpO2 100%  Physical Exam  Nursing note and vitals  reviewed. Constitutional: He appears well-developed and well-nourished.  HENT:  Head: Normocephalic and atraumatic.  Left Ear: External ear normal.  Nose: Nose normal.       Broken tooth  Eyes: Pupils are equal, round, and reactive to light.  Neck: Normal range of motion.  Cardiovascular: Normal rate.   Pulmonary/Chest: Effort normal.  Musculoskeletal: Normal range of motion.  Neurological: He is alert.  Skin: Skin is warm and dry.  Psychiatric: He has a normal mood and affect.    ED Course  Procedures (including critical care time)  Labs Reviewed - No data to display No results found.   No diagnosis found.    MDM  Pt advised that he needs a dentist.  I advised that he needs dental work not medication.   Medical screening examination/treatment/procedure(s) were performed by non-physician practitioner and as supervising physician I was immediately available for consultation/collaboration. Osvaldo Human, M.D.      Langston Masker, [place] 01/06/11 1243  Langston Masker, [place] 01/06/11 1247  Carleene Cooper III, MD 01/07/11 [phone]

## 2011-02-11 ENCOUNTER — Encounter (HOSPITAL_BASED_OUTPATIENT_CLINIC_OR_DEPARTMENT_OTHER): Payer: Self-pay | Admitting: *Deleted

## 2011-02-11 ENCOUNTER — Emergency Department (HOSPITAL_BASED_OUTPATIENT_CLINIC_OR_DEPARTMENT_OTHER)
Admission: EM | Admit: 2011-02-11 | Discharge: 2011-02-11 | Disposition: A | Discharge status: Home / Self Care | Payer: No Typology Code available for payment source | Attending: Emergency Medicine | Admitting: Emergency Medicine

## 2011-02-11 DIAGNOSIS — Y92410 Unspecified street and highway as the place of occurrence of the external cause: Secondary | ICD-10-CM | POA: Insufficient documentation

## 2011-02-11 DIAGNOSIS — F17200 Nicotine dependence, unspecified, uncomplicated: Secondary | ICD-10-CM | POA: Insufficient documentation

## 2011-02-11 DIAGNOSIS — M549 Dorsalgia, unspecified: Secondary | ICD-10-CM | POA: Insufficient documentation

## 2011-02-11 MED ORDER — CYCLOBENZAPRINE HCL 10 MG PO TABS
10.0000 mg | ORAL_TABLET | Freq: Once | ORAL | Status: AC
  Administered 2011-02-11: 10 mg via ORAL

## 2011-02-11 MED ORDER — CYCLOBENZAPRINE HCL 5 MG PO TABS: 5.0000 mg | ORAL_TABLET | Freq: Two times a day (BID) | ORAL | Status: AC | PRN

## 2011-02-11 MED ORDER — CYCLOBENZAPRINE HCL 10 MG PO TABS
ORAL_TABLET | ORAL | Status: AC
  Administered 2011-02-11: 10 mg via ORAL
  Filled 2011-02-11: qty 1
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
## 2012-04-05 IMAGING — CR DG THORACIC SPINE 2V
3 series · 3 of 3 positions shown · non-contrast
Comparison: 03/16/2009

CLINICAL DATA: Back pain

THORACIC SPINE - 2 VIEW

[w t-spine a.p. *]
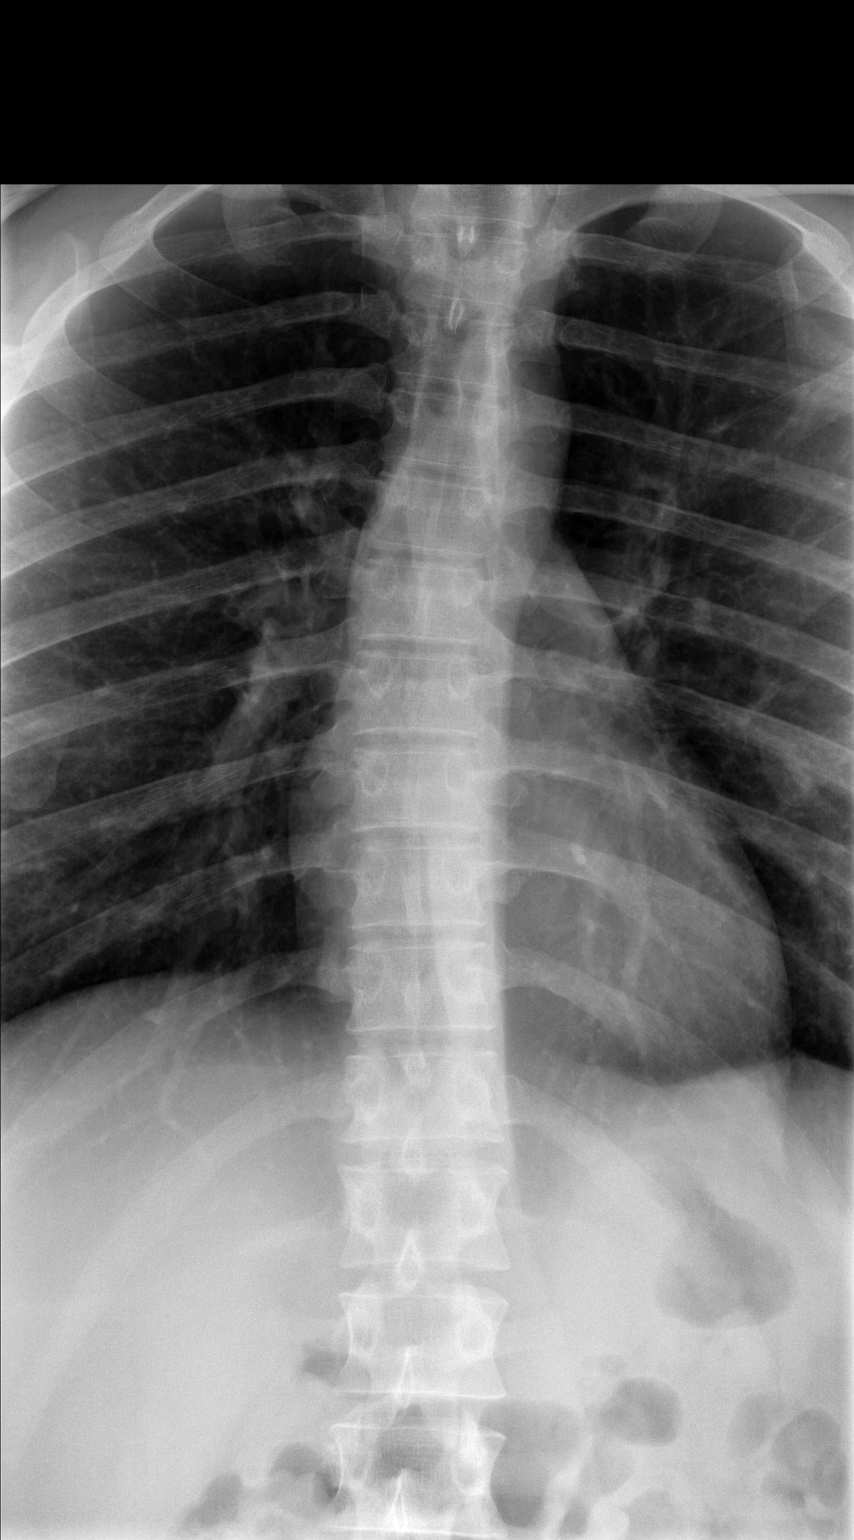

[w t-spine lat *]
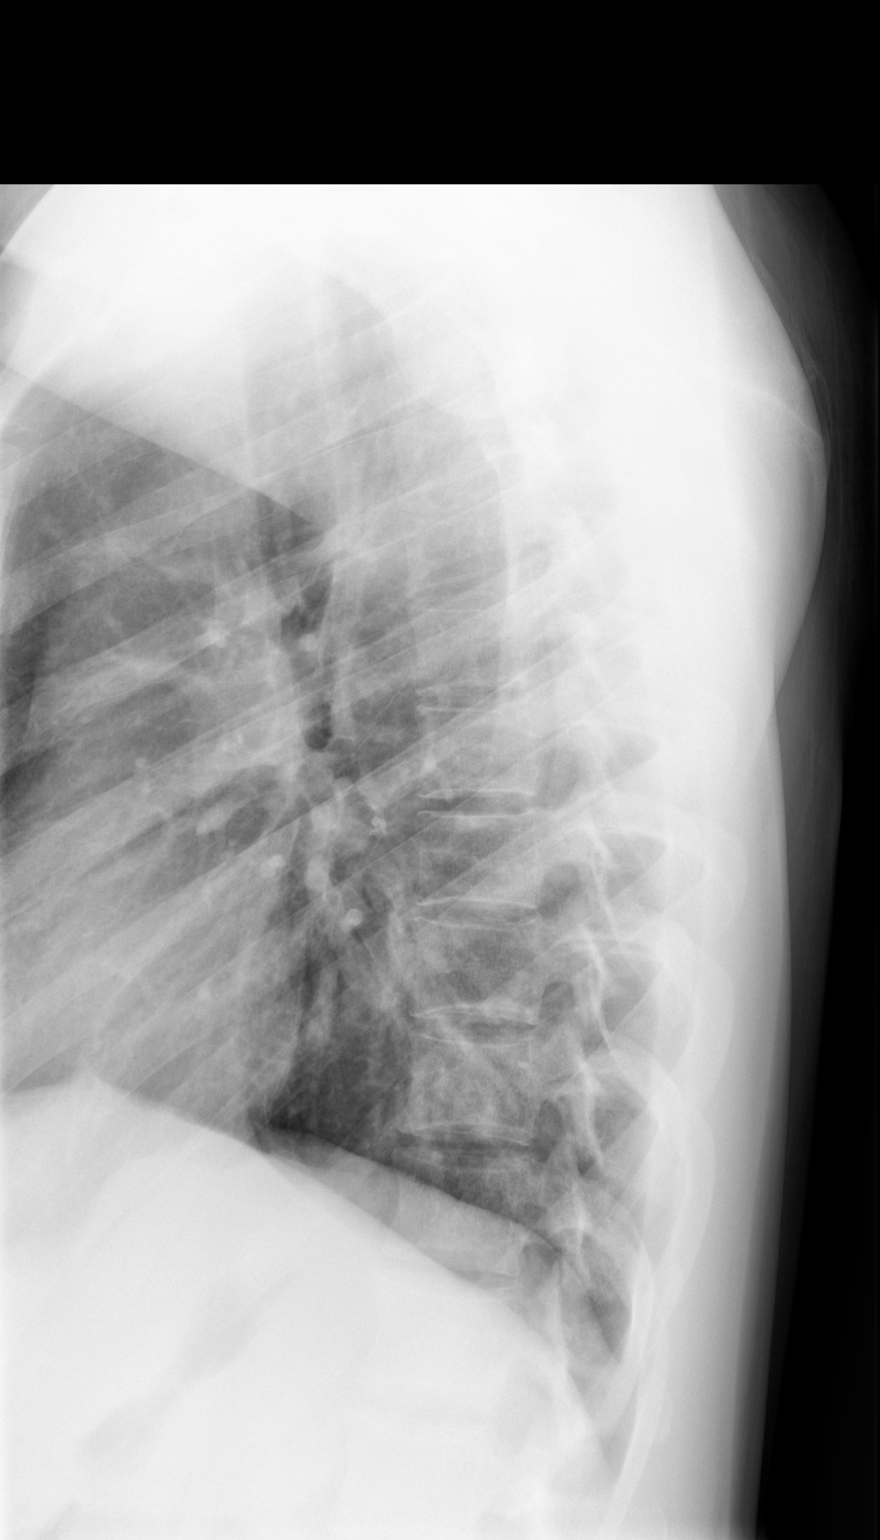

[w swimmers view]
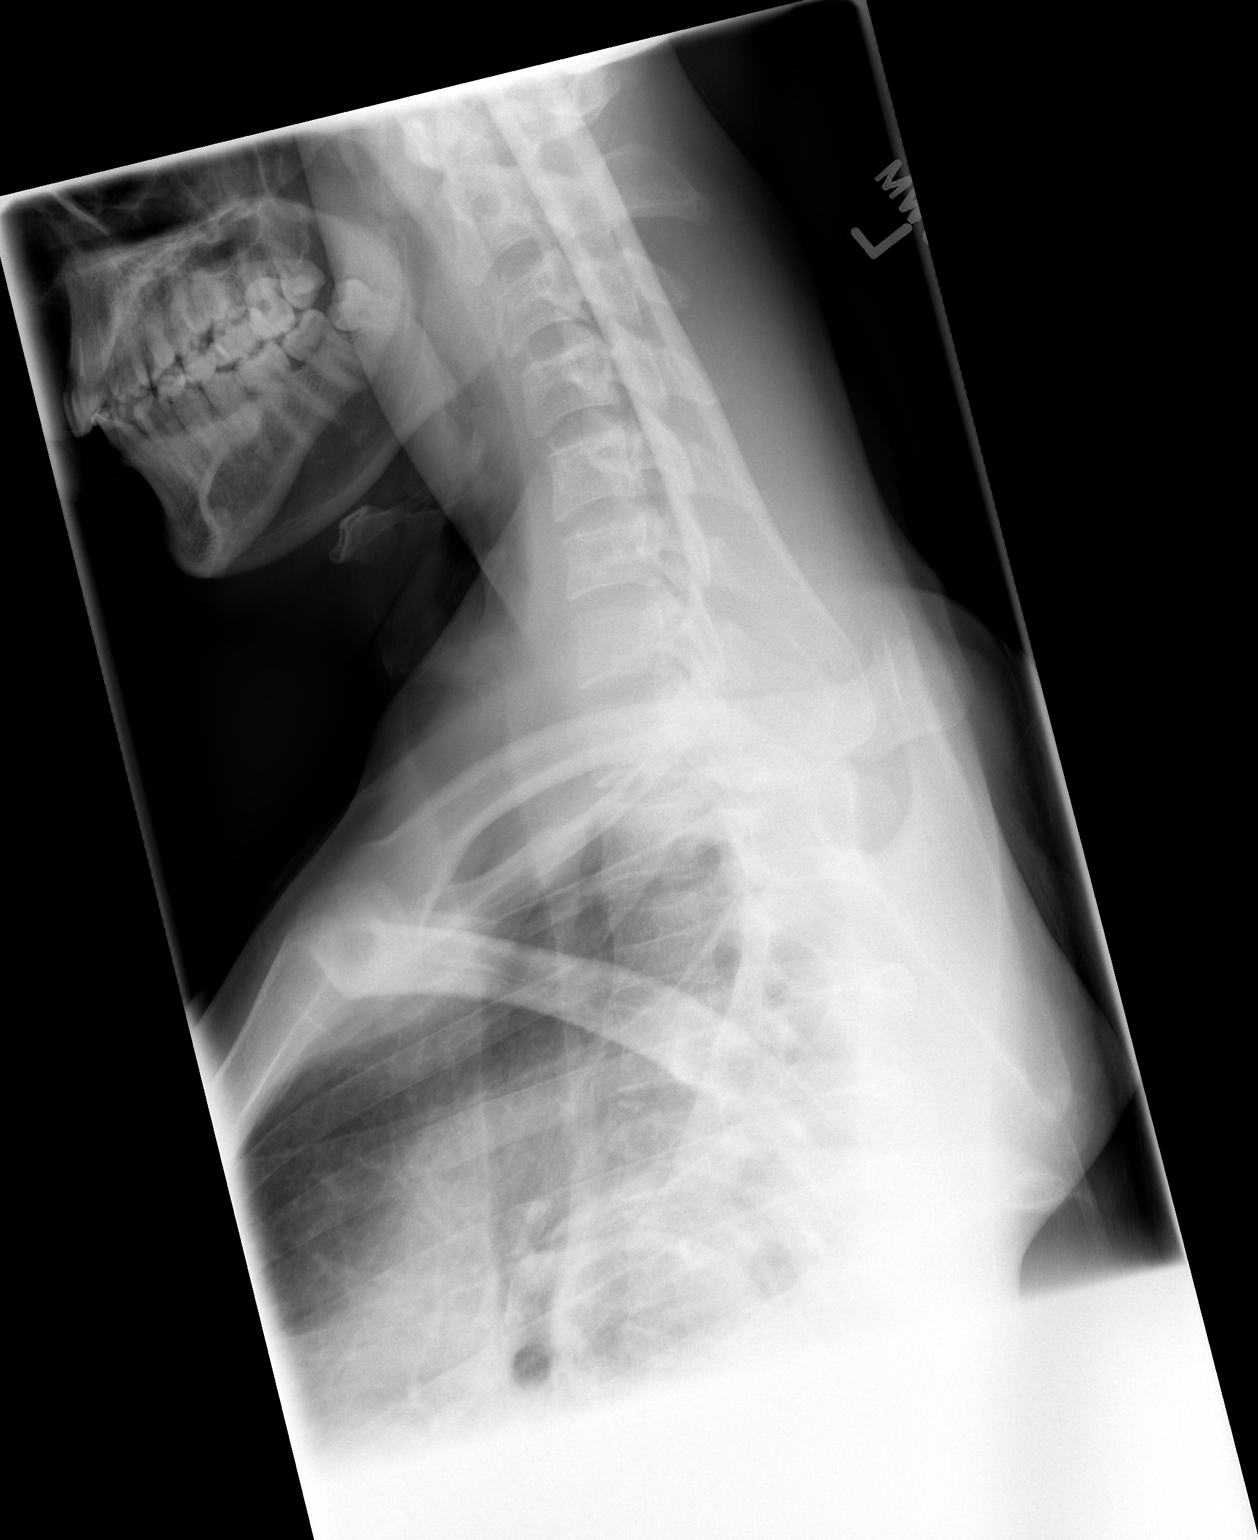

[3 of 3 positions shown; findings below may reference images not displayed]

FINDINGS: Slight dextroscoliosis of the mid thoracic spine may be
positional.  Anatomic alignment on the lateral view.  No vertebral
compression deformity.
IMPRESSION: No acute bony pathology.

## 2015-08-18 ENCOUNTER — Emergency Department (HOSPITAL_BASED_OUTPATIENT_CLINIC_OR_DEPARTMENT_OTHER)
Admission: EM | Admit: 2015-08-18 | Discharge: 2015-08-18 | Disposition: A | Discharge status: Home / Self Care | Payer: No Typology Code available for payment source | Attending: Emergency Medicine | Admitting: Emergency Medicine

## 2015-08-18 ENCOUNTER — Encounter (HOSPITAL_BASED_OUTPATIENT_CLINIC_OR_DEPARTMENT_OTHER): Payer: Self-pay | Admitting: *Deleted

## 2015-08-18 DIAGNOSIS — Y990 Civilian activity done for income or pay: Secondary | ICD-10-CM | POA: Insufficient documentation

## 2015-08-18 DIAGNOSIS — Z5321 Procedure and treatment not carried out due to patient leaving prior to being seen by health care provider: Secondary | ICD-10-CM | POA: Insufficient documentation

## 2015-08-18 DIAGNOSIS — Y9389 Activity, other specified: Secondary | ICD-10-CM | POA: Insufficient documentation

## 2015-08-18 DIAGNOSIS — F17200 Nicotine dependence, unspecified, uncomplicated: Secondary | ICD-10-CM | POA: Insufficient documentation

## 2015-08-18 DIAGNOSIS — Y929 Unspecified place or not applicable: Secondary | ICD-10-CM | POA: Insufficient documentation

## 2015-08-18 DIAGNOSIS — M549 Dorsalgia, unspecified: Secondary | ICD-10-CM | POA: Insufficient documentation

## 2015-08-18 DIAGNOSIS — X500XXA Overexertion from strenuous movement or load, initial encounter: Secondary | ICD-10-CM | POA: Insufficient documentation

## 2015-08-18 HISTORY — DX: Anxiety disorder, unspecified: F41.9

## 2015-08-18 LAB — URINALYSIS, ROUTINE W REFLEX MICROSCOPIC
Bilirubin Urine: NEGATIVE
GLUCOSE, UA: NEGATIVE mg/dL
Hgb urine dipstick: NEGATIVE
KETONES UR: NEGATIVE mg/dL
LEUKOCYTES UA: NEGATIVE
NITRITE: NEGATIVE
PH: 5.5 (ref 5.0–8.0)
Protein, ur: NEGATIVE mg/dL
SPECIFIC GRAVITY, URINE: 1.018 (ref 1.005–1.030)

## 2015-08-18 NOTE — ED Provider Notes (Signed)
I went to the patient's room and there was no one present. Upon return to the room nurses were making bed. Patient has left without any physician encounter. Left without being seen by physician.  Arby BarretteMarcy Ashby [place]ef[place], MD 08/18/15 1520

## 2015-08-18 NOTE — ED Notes (Signed)
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 Known slipped disc and pinched nerve in his back.

## 2015-08-18 NOTE — ED Notes (Signed)
Pt c/o hurting back while lifting something heavy at work. Pt states he has numbness in his R foot. Pt with hx of back problems. Pt denies weakness.

## 2020-10-02 DEATH — deceased
